# Patient Record
Sex: Male | Born: 1985 | Race: Black or African American | Hispanic: No | Marital: Single | Smoking: Current every day smoker
Health system: Southern US, Community
[De-identification: ages and names within clinical notes are randomized; demographics above are authoritative.]

## PROBLEM LIST (undated history)

## (undated) DIAGNOSIS — W3400XA Accidental discharge from unspecified firearms or gun, initial encounter: Secondary | ICD-10-CM

## (undated) DIAGNOSIS — I10 Essential (primary) hypertension: Secondary | ICD-10-CM

## (undated) HISTORY — PX: LEG SURGERY: SHX1003

## (undated) HISTORY — PX: ABDOMINAL SURGERY: SHX537

## (undated) HISTORY — PX: OTHER SURGICAL HISTORY: SHX169

---

## 2006-03-31 ENCOUNTER — Emergency Department (HOSPITAL_COMMUNITY): Admission: EM | Admit: 2006-03-31 | Discharge: 2006-03-31 | Payer: Self-pay | Admitting: Emergency Medicine

## 2006-04-29 ENCOUNTER — Emergency Department (HOSPITAL_COMMUNITY): Admission: EM | Admit: 2006-04-29 | Discharge: 2006-04-29 | Payer: Self-pay | Admitting: Emergency Medicine

## 2006-04-29 ENCOUNTER — Ambulatory Visit: Payer: Self-pay | Admitting: Internal Medicine

## 2006-09-15 ENCOUNTER — Emergency Department (HOSPITAL_COMMUNITY): Admission: EM | Admit: 2006-09-15 | Discharge: 2006-09-15 | Payer: Self-pay | Admitting: Emergency Medicine

## 2006-09-18 ENCOUNTER — Emergency Department (HOSPITAL_COMMUNITY): Admission: EM | Admit: 2006-09-18 | Discharge: 2006-09-18 | Payer: Self-pay | Admitting: Emergency Medicine

## 2008-06-09 ENCOUNTER — Emergency Department (HOSPITAL_COMMUNITY): Admission: EM | Admit: 2008-06-09 | Discharge: 2008-06-09 | Payer: Self-pay | Admitting: Emergency Medicine
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
## 2011-05-12 LAB — URINALYSIS, ROUTINE W REFLEX MICROSCOPIC
Glucose, UA: NEGATIVE
Protein, ur: NEGATIVE
Urobilinogen, UA: 0.2

## 2011-05-12 LAB — COMPREHENSIVE METABOLIC PANEL
Albumin: 4.4
Alkaline Phosphatase: 56
BUN: 8
CO2: 27
Creatinine, Ser: 0.81
GFR calc Af Amer: >60
GFR calc non Af Amer: >60
Glucose, Bld: 102 — ABNORMAL HIGH
Potassium: 3.6

## 2011-05-12 LAB — DIFFERENTIAL
Basophils Relative: 0
Lymphs Abs: 0.8
Monocytes Relative: 4
Neutro Abs: 10.6 — ABNORMAL HIGH

## 2011-05-12 LAB — CBC
HCT: 46.2
MCV: 101.5 — ABNORMAL HIGH
RBC: 4.55

## 2014-09-24 ENCOUNTER — Emergency Department (HOSPITAL_COMMUNITY): Payer: Self-pay

## 2014-09-24 ENCOUNTER — Emergency Department (HOSPITAL_COMMUNITY)
Admission: EM | Admit: 2014-09-24 | Discharge: 2014-09-24 | Disposition: A | Discharge status: Home / Self Care | Payer: Self-pay | Attending: Emergency Medicine | Admitting: Emergency Medicine

## 2014-09-24 ENCOUNTER — Encounter (HOSPITAL_COMMUNITY): Payer: Self-pay | Admitting: Emergency Medicine

## 2014-09-24 DIAGNOSIS — R112 Nausea with vomiting, unspecified: Secondary | ICD-10-CM | POA: Insufficient documentation

## 2014-09-24 DIAGNOSIS — Z9889 Other specified postprocedural states: Secondary | ICD-10-CM | POA: Insufficient documentation

## 2014-09-24 LAB — COMPREHENSIVE METABOLIC PANEL
ALBUMIN: 4.6 g/dL (ref 3.5–5.2)
ALK PHOS: 51 U/L (ref 39–117)
ALT: 34 U/L (ref 0–53)
ANION GAP: 6 (ref 5–15)
AST: 30 U/L (ref 0–37)
BILIRUBIN TOTAL: 1.2 mg/dL (ref 0.3–1.2)
BUN: 17 mg/dL (ref 6–23)
CHLORIDE: 107 mmol/L (ref 96–112)
CO2: 26 mmol/L (ref 19–32)
Calcium: 9.4 mg/dL (ref 8.4–10.5)
Creatinine, Ser: 0.97 mg/dL (ref 0.50–1.35)
GLUCOSE: 97 mg/dL (ref 70–99)
POTASSIUM: 4.2 mmol/L (ref 3.5–5.1)
Sodium: 139 mmol/L (ref 135–145)
TOTAL PROTEIN: 8.1 g/dL (ref 6.0–8.3)

## 2014-09-24 LAB — CBC WITH DIFFERENTIAL/PLATELET
Basophils Absolute: 0 10*3/uL (ref 0.0–0.1)
Basophils Relative: 0 % (ref 0–1)
EOS ABS: 0.1 10*3/uL (ref 0.0–0.7)
EOS PCT: 1 % (ref 0–5)
HCT: 47 % (ref 39.0–52.0)
Hemoglobin: 16.3 g/dL (ref 13.0–17.0)
LYMPHS ABS: 0.7 10*3/uL (ref 0.7–4.0)
LYMPHS PCT: 9 % — AB (ref 12–46)
MCH: 33.4 pg (ref 26.0–34.0)
MCHC: 34.7 g/dL (ref 30.0–36.0)
MCV: 96.3 fL (ref 78.0–100.0)
MONOS PCT: 6 % (ref 3–12)
Monocytes Absolute: 0.5 10*3/uL (ref 0.1–1.0)
NEUTROS ABS: 6.4 10*3/uL (ref 1.7–7.7)
NEUTROS PCT: 84 % — AB (ref 43–77)
Platelets: 153 10*3/uL (ref 150–400)
RBC: 4.88 MIL/uL (ref 4.22–5.81)
RDW: 12 % (ref 11.5–15.5)
WBC: 7.6 10*3/uL (ref 4.0–10.5)

## 2014-09-24 LAB — LIPASE, BLOOD: LIPASE: 24 U/L (ref 11–59)

## 2014-09-24 MED ORDER — IOHEXOL 300 MG/ML  SOLN
100.0000 mL | Freq: Once | INTRAMUSCULAR | Status: AC | PRN
Start: 2014-09-24 — End: 2014-09-24
  Administered 2014-09-24: 100 mL via INTRAVENOUS

## 2014-09-24 MED ORDER — ONDANSETRON 8 MG PO TBDP: 8.0000 mg | ORAL_TABLET | Freq: Three times a day (TID) | ORAL | Status: DC | PRN

## 2014-09-24 MED ORDER — SODIUM CHLORIDE 0.9 % IV SOLN
1000.0000 mL | INTRAVENOUS | Status: DC
  Administered 2014-09-24: 1000 mL via INTRAVENOUS

## 2014-09-24 MED ORDER — IOHEXOL 300 MG/ML  SOLN
50.0000 mL | Freq: Once | INTRAMUSCULAR | Status: AC | PRN
  Administered 2014-09-24: 50 mL via ORAL

## 2014-09-24 MED ORDER — ONDANSETRON HCL 4 MG/2ML IJ SOLN
4.0000 mg | Freq: Once | INTRAMUSCULAR | Status: AC
  Administered 2014-09-24: 4 mg via INTRAVENOUS
  Filled 2014-09-24: qty 2

## 2014-09-24 MED ORDER — SODIUM CHLORIDE 0.9 % IV SOLN
1000.0000 mL | Freq: Once | INTRAVENOUS | Status: AC
  Administered 2014-09-24: 1000 mL via INTRAVENOUS

## 2014-09-24 MED ORDER — PROMETHAZINE HCL 25 MG PO TABS: 25.0000 mg | ORAL_TABLET | Freq: Four times a day (QID) | ORAL | Status: DC | PRN

## 2014-09-24 MED ORDER — MORPHINE SULFATE 4 MG/ML IJ SOLN
4.0000 mg | Freq: Once | INTRAMUSCULAR | Status: AC
  Administered 2014-09-24: 4 mg via INTRAVENOUS
  Filled 2014-09-24: qty 1

## 2014-09-24 NOTE — ED Notes (Addendum)
Pt reports lower abdominal pain since this am. Pt reports n/v. Pt reports LBM yesterday.
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
## 2014-09-24 NOTE — Discharge Instructions (Signed)

## 2016-04-11 ENCOUNTER — Encounter (HOSPITAL_COMMUNITY): Payer: Self-pay | Admitting: Emergency Medicine

## 2016-04-11 ENCOUNTER — Emergency Department (HOSPITAL_COMMUNITY)
Admission: EM | Admit: 2016-04-11 | Discharge: 2016-04-11 | Disposition: A | Discharge status: Home / Self Care | Payer: Self-pay | Attending: Emergency Medicine | Admitting: Emergency Medicine

## 2016-04-11 DIAGNOSIS — Z79899 Other long term (current) drug therapy: Secondary | ICD-10-CM | POA: Insufficient documentation

## 2016-04-11 DIAGNOSIS — Y929 Unspecified place or not applicable: Secondary | ICD-10-CM | POA: Insufficient documentation

## 2016-04-11 DIAGNOSIS — F17210 Nicotine dependence, cigarettes, uncomplicated: Secondary | ICD-10-CM | POA: Insufficient documentation

## 2016-04-11 DIAGNOSIS — W57XXXA Bitten or stung by nonvenomous insect and other nonvenomous arthropods, initial encounter: Secondary | ICD-10-CM | POA: Insufficient documentation

## 2016-04-11 DIAGNOSIS — S40261A Insect bite (nonvenomous) of right shoulder, initial encounter: Secondary | ICD-10-CM | POA: Insufficient documentation

## 2016-04-11 DIAGNOSIS — S50862A Insect bite (nonvenomous) of left forearm, initial encounter: Secondary | ICD-10-CM | POA: Insufficient documentation

## 2016-04-11 DIAGNOSIS — Y999 Unspecified external cause status: Secondary | ICD-10-CM | POA: Insufficient documentation

## 2016-04-11 DIAGNOSIS — Y939 Activity, unspecified: Secondary | ICD-10-CM | POA: Insufficient documentation

## 2016-04-11 DIAGNOSIS — L819 Disorder of pigmentation, unspecified: Secondary | ICD-10-CM | POA: Insufficient documentation

## 2016-04-11 MED ORDER — FAMOTIDINE 20 MG PO TABS
20.0000 mg | ORAL_TABLET | Freq: Once | ORAL | Status: AC
  Administered 2016-04-11: 20 mg via ORAL
  Filled 2016-04-11: qty 1

## 2016-04-11 MED ORDER — DIPHENHYDRAMINE HCL 50 MG/ML IJ SOLN
50.0000 mg | Freq: Once | INTRAMUSCULAR | Status: AC
  Administered 2016-04-11: 50 mg via INTRAMUSCULAR
  Filled 2016-04-11: qty 1

## 2016-04-11 MED ORDER — PREDNISONE 20 MG PO TABS: ORAL_TABLET | ORAL | 0 refills | Status: DC

## 2016-04-11 MED ORDER — FAMOTIDINE 20 MG PO TABS: 20.0000 mg | ORAL_TABLET | Freq: Two times a day (BID) | ORAL | 0 refills | Status: DC

## 2016-04-11 MED ORDER — METHYLPREDNISOLONE SODIUM SUCC 125 MG IJ SOLR
125.0000 mg | Freq: Once | INTRAMUSCULAR | Status: AC
  Administered 2016-04-11: 125 mg via INTRAMUSCULAR
  Filled 2016-04-11: qty 2

## 2016-04-11 NOTE — Discharge Instructions (Signed)
Stay cool, heat will make the rash itch more. Take the medications as prescribed with zyrtec 10 mg once a day OR benadryl 50 mg every 6 hrs for itching or swelling. Recheck if you get difficulty swallowing or breathing.  Call Dr Okey RegalJohn Hall's office (dermatologist) to have that lesion evaluated on your left thigh.  His office is on [address], [place].  [phone] office phone number.
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
## 2016-04-11 NOTE — ED Triage Notes (Signed)
Patient notice insect bite to right forearm arm and right upper arm.

## 2016-04-11 NOTE — ED Triage Notes (Addendum)
Patient also has mole in right inner thigh he wants checked.

## 2016-09-03 ENCOUNTER — Encounter (HOSPITAL_COMMUNITY): Payer: Self-pay | Admitting: Emergency Medicine

## 2016-09-03 ENCOUNTER — Emergency Department (HOSPITAL_COMMUNITY)
Admission: EM | Admit: 2016-09-03 | Discharge: 2016-09-03 | Disposition: A | Discharge status: Home / Self Care | Payer: Self-pay | Attending: Emergency Medicine | Admitting: Emergency Medicine

## 2016-09-03 DIAGNOSIS — J029 Acute pharyngitis, unspecified: Secondary | ICD-10-CM | POA: Insufficient documentation

## 2016-09-03 DIAGNOSIS — F17210 Nicotine dependence, cigarettes, uncomplicated: Secondary | ICD-10-CM | POA: Insufficient documentation

## 2016-09-03 DIAGNOSIS — M791 Myalgia: Secondary | ICD-10-CM | POA: Insufficient documentation

## 2016-09-03 DIAGNOSIS — J111 Influenza due to unidentified influenza virus with other respiratory manifestations: Secondary | ICD-10-CM

## 2016-09-03 DIAGNOSIS — R69 Illness, unspecified: Secondary | ICD-10-CM

## 2016-09-03 DIAGNOSIS — R509 Fever, unspecified: Secondary | ICD-10-CM | POA: Insufficient documentation

## 2016-09-03 DIAGNOSIS — R0989 Other specified symptoms and signs involving the circulatory and respiratory systems: Secondary | ICD-10-CM | POA: Insufficient documentation

## 2016-09-03 DIAGNOSIS — J45909 Unspecified asthma, uncomplicated: Secondary | ICD-10-CM | POA: Insufficient documentation

## 2016-09-03 MED ORDER — IBUPROFEN 400 MG PO TABS
600.0000 mg | ORAL_TABLET | Freq: Once | ORAL | Status: AC
  Administered 2016-09-03: 600 mg via ORAL
  Filled 2016-09-03: qty 2

## 2016-09-03 MED ORDER — OSELTAMIVIR PHOSPHATE 75 MG PO CAPS: 75.0000 mg | ORAL_CAPSULE | Freq: Two times a day (BID) | ORAL | 0 refills | Status: DC

## 2016-09-03 MED ORDER — ACETAMINOPHEN 500 MG PO TABS
1000.0000 mg | ORAL_TABLET | Freq: Once | ORAL | Status: AC
  Administered 2016-09-03: 1000 mg via ORAL
  Filled 2016-09-03: qty 2

## 2016-09-03 MED ORDER — ONDANSETRON 8 MG PO TBDP: 8.0000 mg | ORAL_TABLET | Freq: Three times a day (TID) | ORAL | 0 refills | Status: DC | PRN

## 2016-09-03 NOTE — ED Triage Notes (Signed)
Patient starting having flu-like symptoms yesterday morning, sore throat, chills, running nose, has taken Nyquil at home.

## 2016-09-03 NOTE — ED Provider Notes (Signed)
[facility] Provider Note   CSN: [id number] Arrival date & time: 09/03/16  9562     History   Chief Complaint Chief Complaint  Patient presents with  . Fever    HPI Jeremiah Zuniga is a 31 y.o. male. Patient presents emergency department with sore throat and chills runny nose and myalgias.  He took NyQuil this morning without improvement in his symptoms.  He states he feels achy and feels weak.  Reports nausea without vomiting.  Denies diarrhea.  No sick contacts.  No significant past medical history for the patient.  History of asthma.  No shortness of breath.  Denies abdominal pain.   The history is provided by the patient.      History reviewed. No pertinent past medical history.  There are no active problems to display for this patient.   Past Surgical History:  Procedure Laterality Date  . ABDOMINAL SURGERY    . arm surgery    . LEG SURGERY         Home Medications    Prior to Admission medications   Medication Sig Start Date End Date Taking? Authorizing Provider  famotidine (PEPCID) 20 MG tablet Take 1 tablet (20 mg total) by mouth 2 (two) times daily. 04/11/16  Yes Devoria Albe, MD  ondansetron (ZOFRAN ODT) 8 MG disintegrating tablet Take 1 tablet (8 mg total) by mouth every 8 (eight) hours as needed for nausea or vomiting. 09/03/16   Azalia Bilis, MD  oseltamivir (TAMIFLU) 75 MG capsule Take 1 capsule (75 mg total) by mouth every 12 (twelve) hours. 09/03/16   Azalia Bilis, MD    Family History History reviewed. No pertinent family history.  Social History Social History  Substance Use Topics  . Smoking status: Current Every Day Smoker    Packs/day: 1.00    Years: 2.00    Types: Cigarettes  . Smokeless tobacco: Former Neurosurgeon  . Alcohol use No     Allergies   Patient has no known allergies.   Review of Systems Review of Systems  All other systems reviewed and are negative.    Physical Exam Updated Vital Signs BP 138/79   Pulse 99   Temp  99.3 F (37.4 C) (Oral)   Resp 20   Ht 6' (1.829 m)   Wt 215 lb (97.5 kg)   SpO2 95%   BMI 29.16 kg/m   Physical Exam  Constitutional: He is oriented to person, place, and time. He appears well-developed and well-nourished.  HENT:  Head: Normocephalic and atraumatic.  Posterior pharynx is normal  Eyes: EOM are normal.  Neck: Normal range of motion.  Cardiovascular: Normal rate, regular rhythm and normal heart sounds.   Pulmonary/Chest: Effort normal and breath sounds normal. No respiratory distress.  Abdominal: Soft. He exhibits no distension. There is no tenderness.  Musculoskeletal: Normal range of motion.  Neurological: He is alert and oriented to person, place, and time.  Skin: Skin is warm and dry.  Psychiatric: He has a normal mood and affect. Judgment normal.  Nursing note and vitals reviewed.    ED Treatments / Results  Labs (all labs ordered are listed, but only abnormal results are displayed) Labs Reviewed - No data to display  EKG  EKG Interpretation None       Radiology No results found.  Procedures Procedures (including critical care time)  Medications Ordered in ED Medications  ibuprofen (ADVIL,MOTRIN) tablet 600 mg (600 mg Oral Given 09/03/16 0732)  acetaminophen (TYLENOL) tablet 1,000 mg (1,000 mg  Oral Given 09/03/16 0732)     Initial Impression / Assessment and Plan / ED Course  I have reviewed the triage vital signs and the nursing notes.  Pertinent labs & imaging results that were available during my care of the patient were reviewed by me and considered in my medical decision making (see chart for details).     Likely influenza-like illness.  Home with instructions for oral hydration and fever control.  Patient understands return to the ER for new or worsening symptoms.  993 oral temperature here.  Heart rate 99.  Patient keeping fluids down.  Final Clinical Impressions(s) / ED Diagnoses   Final diagnoses:  Influenza-like illness     New Prescriptions New Prescriptions   ONDANSETRON (ZOFRAN ODT) 8 MG DISINTEGRATING TABLET    Take 1 tablet (8 mg total) by mouth every 8 (eight) hours as needed for nausea or vomiting.   OSELTAMIVIR (TAMIFLU) 75 MG CAPSULE    Take 1 capsule (75 mg total) by mouth every 12 (twelve) hours.     Azalia BilisKevin Phelan Schadt, MD 09/03/16 [phone]

## 2016-11-27 ENCOUNTER — Emergency Department (HOSPITAL_COMMUNITY)
Admission: EM | Admit: 2016-11-27 | Discharge: 2016-11-27 | Disposition: A | Discharge status: Home / Self Care | Payer: Self-pay | Attending: Dermatology | Admitting: Dermatology

## 2016-11-27 ENCOUNTER — Encounter (HOSPITAL_COMMUNITY): Payer: Self-pay | Admitting: *Deleted

## 2016-11-27 DIAGNOSIS — R42 Dizziness and giddiness: Secondary | ICD-10-CM | POA: Insufficient documentation

## 2016-11-27 DIAGNOSIS — F17210 Nicotine dependence, cigarettes, uncomplicated: Secondary | ICD-10-CM | POA: Insufficient documentation

## 2016-11-27 NOTE — ED Triage Notes (Addendum)
Pt c/o throat feeling "tight" and dizziness that happened this morning around 1230. Pt denies the throat tightness at this time but still reports the dizziness. Pt denies any unusual medications or food today. Pt states, "I feel like I'm going crazy".

## 2016-11-27 NOTE — ED Notes (Signed)
Called to go to room x 1 with no answer

## 2016-11-27 NOTE — ED Notes (Signed)
Called for pt with no response. 

## 2016-11-27 NOTE — ED Notes (Signed)
Called to triage again with no answer  

## 2016-11-28 ENCOUNTER — Emergency Department (HOSPITAL_COMMUNITY): Payer: Self-pay

## 2016-11-28 ENCOUNTER — Emergency Department (HOSPITAL_COMMUNITY)
Admission: EM | Admit: 2016-11-28 | Discharge: 2016-11-28 | Disposition: A | Discharge status: Home / Self Care | Payer: Self-pay | Attending: Emergency Medicine | Admitting: Emergency Medicine

## 2016-11-28 ENCOUNTER — Encounter (HOSPITAL_COMMUNITY): Payer: Self-pay | Admitting: *Deleted

## 2016-11-28 DIAGNOSIS — F17210 Nicotine dependence, cigarettes, uncomplicated: Secondary | ICD-10-CM | POA: Insufficient documentation

## 2016-11-28 DIAGNOSIS — R0789 Other chest pain: Secondary | ICD-10-CM | POA: Insufficient documentation

## 2016-11-28 LAB — CBC
HCT: 46.8 % (ref 39.0–52.0)
Hemoglobin: 16.2 g/dL (ref 13.0–17.0)
MCH: 33.2 pg (ref 26.0–34.0)
MCHC: 34.6 g/dL (ref 30.0–36.0)
MCV: 95.9 fL (ref 78.0–100.0)
Platelets: 162 10*3/uL (ref 150–400)
RBC: 4.88 MIL/uL (ref 4.22–5.81)
RDW: 12.3 % (ref 11.5–15.5)
WBC: 7.7 10*3/uL (ref 4.0–10.5)

## 2016-11-28 LAB — BASIC METABOLIC PANEL
Anion gap: 9 (ref 5–15)
BUN: 11 mg/dL (ref 6–20)
CO2: 26 mmol/L (ref 22–32)
CREATININE: 1.03 mg/dL (ref 0.61–1.24)
Calcium: 9.7 mg/dL (ref 8.9–10.3)
Chloride: 102 mmol/L (ref 101–111)
GFR calc non Af Amer: >60 mL/min (ref >60)
Glucose, Bld: 79 mg/dL (ref 65–99)
Potassium: 3.8 mmol/L (ref 3.5–5.1)
Sodium: 137 mmol/L (ref 135–145)

## 2016-11-28 LAB — I-STAT TROPONIN, ED
TROPONIN I, POC: 0 ng/mL (ref 0.00–0.08)
Troponin i, poc: 0 ng/mL (ref 0.00–0.08)

## 2016-11-28 LAB — D-DIMER, QUANTITATIVE (NOT AT ARMC): D DIMER QUANT: 0.46 ug{FEU}/mL (ref 0.00–0.50)

## 2016-11-28 MED ORDER — FAMOTIDINE 20 MG PO TABS: 20.0000 mg | ORAL_TABLET | Freq: Two times a day (BID) | ORAL | 0 refills | Status: DC

## 2016-11-28 MED ORDER — GI COCKTAIL ~~LOC~~
30.0000 mL | Freq: Once | ORAL | Status: AC
  Administered 2016-11-28: 30 mL via ORAL
  Filled 2016-11-28: qty 30

## 2016-11-28 MED ORDER — OMEPRAZOLE 20 MG PO CPDR: 20.0000 mg | DELAYED_RELEASE_CAPSULE | Freq: Every day | ORAL | 0 refills | Status: DC

## 2016-11-28 NOTE — ED Notes (Signed)
Pt st's he is irritated and ready to go home, st's he has been here all day and is hungry.

## 2016-11-28 NOTE — ED Notes (Signed)
Pt stable, ambulatory, states understanding of discharge instructions 

## 2016-11-28 NOTE — ED Notes (Signed)
Called Pt X3 to reassess vitals no answer

## 2016-11-28 NOTE — ED Triage Notes (Signed)
Pt states that he began having central chest pain 2 days ago. Pt states that pain is intermittent. Pt reports feeling lightheaded. Pt reports a sensation in his throat like he "had something there". Pt reports hx of acid reflux.
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
## 2016-11-28 NOTE — ED Notes (Signed)
Pt st's he feels much better after GI cocktail

## 2017-02-24 ENCOUNTER — Encounter (HOSPITAL_COMMUNITY): Payer: Self-pay | Admitting: Emergency Medicine

## 2017-02-24 ENCOUNTER — Emergency Department (HOSPITAL_COMMUNITY)
Admission: EM | Admit: 2017-02-24 | Discharge: 2017-02-24 | Disposition: A | Discharge status: Home / Self Care | Payer: Self-pay | Attending: Emergency Medicine | Admitting: Emergency Medicine

## 2017-02-24 DIAGNOSIS — F17210 Nicotine dependence, cigarettes, uncomplicated: Secondary | ICD-10-CM | POA: Insufficient documentation

## 2017-02-24 DIAGNOSIS — Z91040 Latex allergy status: Secondary | ICD-10-CM | POA: Insufficient documentation

## 2017-02-24 DIAGNOSIS — K029 Dental caries, unspecified: Secondary | ICD-10-CM | POA: Insufficient documentation

## 2017-02-24 DIAGNOSIS — Z79899 Other long term (current) drug therapy: Secondary | ICD-10-CM | POA: Insufficient documentation

## 2017-02-24 MED ORDER — IBUPROFEN 800 MG PO TABS
800.0000 mg | ORAL_TABLET | Freq: Once | ORAL | Status: AC
  Administered 2017-02-24: 800 mg via ORAL
  Filled 2017-02-24: qty 1

## 2017-02-24 MED ORDER — PENICILLIN V POTASSIUM 250 MG PO TABS
500.0000 mg | ORAL_TABLET | Freq: Once | ORAL | Status: AC
  Administered 2017-02-24: 500 mg via ORAL
  Filled 2017-02-24: qty 2

## 2017-02-24 MED ORDER — PENICILLIN V POTASSIUM 500 MG PO TABS: 500.0000 mg | ORAL_TABLET | Freq: Four times a day (QID) | ORAL | 0 refills | Status: AC

## 2017-02-24 MED ORDER — TRAMADOL HCL 50 MG PO TABS: 50.0000 mg | ORAL_TABLET | Freq: Four times a day (QID) | ORAL | 0 refills | Status: DC | PRN

## 2017-02-24 NOTE — ED Provider Notes (Signed)
TIME SEEN: 6:19 AM  CHIEF COMPLAINT: Left lower dental pain  HPI: Patient is a 31 year old male with no significant past history who presents to the emergency room several days of left lower dental pain. No facial swelling, fever. He does not have a dentist. No difficulty swallowing, speaking or breathing. Has taken Tylenol without relief.  ROS: See HPI Constitutional: no fever  Eyes: no drainage  ENT: no runny nose   Cardiovascular:  no chest pain  Resp: no SOB  GI: no vomiting GU: no dysuria Integumentary: no rash  Allergy: no hives  Musculoskeletal: no leg swelling  Neurological: no slurred speech ROS otherwise negative  PAST MEDICAL HISTORY/PAST SURGICAL HISTORY:  History reviewed. No pertinent past medical history.  MEDICATIONS:  Prior to Admission medications   Medication Sig Start Date End Date Taking? Authorizing Provider  famotidine (PEPCID) 20 MG tablet Take 1 tablet (20 mg total) by mouth 2 (two) times daily. 11/28/16   Fayrene Helperran, [place], PA-C  Multiple Vitamins-Minerals (ONE-A-DAY MENS HEALTH FORMULA) TABS Take 1 tablet by mouth daily.    [provider]  Omega-3 Fatty Acids (FISH OIL PO) Take 1 capsule by mouth 2 (two) times daily.    [provider]  omeprazole (PRILOSEC) 20 MG capsule Take 1 capsule (20 mg total) by mouth daily. 11/28/16   Fayrene Helperran, [place], PA-C  ondansetron (ZOFRAN ODT) 8 MG disintegrating tablet Take 1 tablet (8 mg total) by mouth every 8 (eight) hours as needed for nausea or vomiting. Patient not taking: Reported on 11/28/2016 09/03/16   Azalia Bilisampos, Kevin, MD  oseltamivir (TAMIFLU) 75 MG capsule Take 1 capsule (75 mg total) by mouth every 12 (twelve) hours. Patient not taking: Reported on 11/28/2016 09/03/16   Azalia Bilisampos, Kevin, MD    ALLERGIES:  Allergies  Allergen Reactions  . Banana Nausea And Vomiting  . Eggs Or Egg-Derived Products Nausea And Vomiting  . Fish Allergy Nausea And Vomiting  . Latex Rash    SOCIAL HISTORY:  Social History   Substance Use Topics  . Smoking status: Current Every Day Smoker    Packs/day: 1.00    Years: 2.00    Types: Cigarettes  . Smokeless tobacco: Former NeurosurgeonUser  . Alcohol use No    FAMILY HISTORY: No family history on file.  EXAM: BP (!) 142/106 (BP Location: Right Arm)   Pulse 79   Temp 98 F (36.7 C) (Oral)   Resp 16   Ht 6' (1.829 m)   Wt 99.8 kg (220 lb)   SpO2 100%   BMI 29.84 kg/m  CONSTITUTIONAL: Alert and oriented and responds appropriately to questions. Well-appearing; well-nourished HEAD: Normocephalic EYES: Conjunctivae clear, pupils appear equal, EOMI ENT: normal nose; moist mucous membranes; No pharyngeal erythema or petechiae, no tonsillar hypertrophy or exudate, no uvular deviation, no unilateral swelling, no trismus or drooling, no muffled voice, normal phonation, no stridor, patient's left lower third molar has decay noted and is tender to palpation, no drainable dental abscess noted, no swelling of the gums, no Ludwig's angina, tongue sits flat in the bottom of the mouth, no angioedema, no facial erythema or warmth, no facial swelling; no pain with movement of the neck. NECK: Supple, no meningismus, no nuchal rigidity, no LAD  CARD: RRR; S1 and S2 appreciated; no murmurs, no clicks, no rubs, no gallops RESP: Normal chest excursion without splinting or tachypnea; breath sounds clear and equal bilaterally; no wheezes, no rhonchi, no rales, no hypoxia or respiratory distress, speaking full sentences ABD/GI: Normal bowel sounds; non-distended;  soft, non-tender, no rebound, no guarding, no peritoneal signs, no hepatosplenomegaly BACK:  The back appears normal and is non-tender to palpation, there is no CVA tenderness EXT: Normal ROM in all joints; non-tender to palpation; no edema; normal capillary refill; no cyanosis, no calf tenderness or swelling    SKIN: Normal color for age and race; warm; no rash NEURO: Moves all extremities equally PSYCH: The patient's mood and  manner are appropriate. Grooming and personal hygiene are appropriate.  MEDICAL DECISION MAKING: Patient here with dental caries causing dental pain. We'll place on antibiotics. No Ludwig's angina. No sign of drainable abscess on exam. Hemodynamically stable. We'll discharge with short course of tramadol for pain control and recommended alternating Tylenol and Motrin. Have given him outpatient dental resource guide. Discussed return precautions.   At this time, I do not feel there is any life-threatening condition present. I have reviewed and discussed all results (EKG, imaging, lab, urine as appropriate) and exam findings with patient/family. I have reviewed nursing notes and appropriate previous records.  I feel the patient is safe to be discharged home without further emergent workup and can continue workup as an outpatient as needed. Discussed usual and customary return precautions. Patient/family verbalize understanding and are comfortable with this plan.  Outpatient follow-up has been provided if needed. All questions have been answered.     [place] Dodgen, Layla Maw, DO 02/24/17 [phone]

## 2017-02-24 NOTE — ED Triage Notes (Addendum)
Pt presents with sharp left lower jaw pain that started 2-3 days ago. Pt reports painful to chew. There is a hole on back lower left tooth. Denies difficulty swallowing. Denies oral trauma. No swelling noted.

## 2017-02-24 NOTE — Discharge Instructions (Signed)
You may alternate Tylenol 1000 mg every 6 hours as needed for pain and Ibuprofen 800 mg every 8 hours as needed for pain.  Please take Ibuprofen with food. ° °

## 2017-12-24 ENCOUNTER — Emergency Department (HOSPITAL_COMMUNITY): Payer: Self-pay | Admitting: Anesthesiology

## 2017-12-24 ENCOUNTER — Emergency Department (HOSPITAL_COMMUNITY): Payer: Self-pay

## 2017-12-24 ENCOUNTER — Encounter (HOSPITAL_COMMUNITY): Payer: Self-pay

## 2017-12-24 ENCOUNTER — Encounter (HOSPITAL_COMMUNITY)
Admission: EM | Disposition: A | Discharge status: Home / Self Care | Payer: Self-pay | Source: Home / Self Care | Attending: Emergency Medicine

## 2017-12-24 ENCOUNTER — Emergency Department (HOSPITAL_COMMUNITY)
Admission: EM | Admit: 2017-12-24 | Discharge: 2017-12-24 | Disposition: A | Discharge status: Home / Self Care | Payer: Self-pay | Attending: Emergency Medicine | Admitting: Emergency Medicine

## 2017-12-24 ENCOUNTER — Other Ambulatory Visit: Payer: Self-pay

## 2017-12-24 DIAGNOSIS — F17210 Nicotine dependence, cigarettes, uncomplicated: Secondary | ICD-10-CM | POA: Insufficient documentation

## 2017-12-24 DIAGNOSIS — K3580 Unspecified acute appendicitis: Secondary | ICD-10-CM | POA: Insufficient documentation

## 2017-12-24 DIAGNOSIS — Z9049 Acquired absence of other specified parts of digestive tract: Secondary | ICD-10-CM | POA: Insufficient documentation

## 2017-12-24 HISTORY — DX: Accidental discharge from unspecified firearms or gun, initial encounter: W34.00XA

## 2017-12-24 HISTORY — PX: LAPAROSCOPIC APPENDECTOMY: SHX408

## 2017-12-24 LAB — COMPREHENSIVE METABOLIC PANEL
ALT: 61 U/L (ref 17–63)
AST: 33 U/L (ref 15–41)
Albumin: 4.4 g/dL (ref 3.5–5.0)
Alkaline Phosphatase: 60 U/L (ref 38–126)
Anion gap: 7 (ref 5–15)
BUN: 12 mg/dL (ref 6–20)
CHLORIDE: 106 mmol/L (ref 101–111)
CO2: 24 mmol/L (ref 22–32)
CREATININE: 0.77 mg/dL (ref 0.61–1.24)
Calcium: 9.6 mg/dL (ref 8.9–10.3)
Glucose, Bld: 101 mg/dL — ABNORMAL HIGH (ref 65–99)
Potassium: 3.9 mmol/L (ref 3.5–5.1)
SODIUM: 137 mmol/L (ref 135–145)
Total Bilirubin: 1.2 mg/dL (ref 0.3–1.2)
Total Protein: 7.7 g/dL (ref 6.5–8.1)

## 2017-12-24 LAB — CBC WITH DIFFERENTIAL/PLATELET
BASOS PCT: 0 %
Basophils Absolute: 0 10*3/uL (ref 0.0–0.1)
EOS ABS: 0.3 10*3/uL (ref 0.0–0.7)
Eosinophils Relative: 2 %
HCT: 45 % (ref 39.0–52.0)
Hemoglobin: 15.7 g/dL (ref 13.0–17.0)
LYMPHS ABS: 1.8 10*3/uL (ref 0.7–4.0)
Lymphocytes Relative: 13 %
MCH: 34.3 pg — AB (ref 26.0–34.0)
MCHC: 34.9 g/dL (ref 30.0–36.0)
MCV: 98.3 fL (ref 78.0–100.0)
Monocytes Absolute: 1.1 10*3/uL — ABNORMAL HIGH (ref 0.1–1.0)
Monocytes Relative: 8 %
Neutro Abs: 11 10*3/uL — ABNORMAL HIGH (ref 1.7–7.7)
Neutrophils Relative %: 77 %
PLATELETS: 157 10*3/uL (ref 150–400)
RBC: 4.58 MIL/uL (ref 4.22–5.81)
RDW: 12.1 % (ref 11.5–15.5)
WBC: 14.2 10*3/uL — AB (ref 4.0–10.5)

## 2017-12-24 SURGERY — APPENDECTOMY, LAPAROSCOPIC
Anesthesia: General | Site: Abdomen

## 2017-12-24 MED ORDER — ONDANSETRON HCL 4 MG/2ML IJ SOLN
4.0000 mg | Freq: Once | INTRAMUSCULAR | Status: AC
  Administered 2017-12-24: 4 mg via INTRAVENOUS
  Filled 2017-12-24: qty 2

## 2017-12-24 MED ORDER — BUPIVACAINE HCL (PF) 0.5 % IJ SOLN
INTRAMUSCULAR | Status: AC
  Filled 2017-12-24: qty 30

## 2017-12-24 MED ORDER — SODIUM CHLORIDE 0.9 % IR SOLN
Status: DC | PRN
  Administered 2017-12-24: 1000 mL

## 2017-12-24 MED ORDER — HYDROMORPHONE HCL 1 MG/ML IJ SOLN: 0.2500 mg | INTRAMUSCULAR | Status: DC | PRN

## 2017-12-24 MED ORDER — CEFOTETAN DISODIUM-DEXTROSE 2-2.08 GM-%(50ML) IV SOLR
INTRAVENOUS | Status: AC
  Filled 2017-12-24: qty 50

## 2017-12-24 MED ORDER — SODIUM CHLORIDE 0.9 % IV SOLN
2.0000 g | Freq: Once | INTRAVENOUS | Status: AC
  Administered 2017-12-24: 2 g via INTRAVENOUS
  Filled 2017-12-24: qty 2

## 2017-12-24 MED ORDER — FENTANYL CITRATE (PF) 100 MCG/2ML IJ SOLN
INTRAMUSCULAR | Status: AC
  Filled 2017-12-24: qty 2

## 2017-12-24 MED ORDER — ROCURONIUM BROMIDE 100 MG/10ML IV SOLN
INTRAVENOUS | Status: DC | PRN
  Administered 2017-12-24: 30 mg via INTRAVENOUS
  Administered 2017-12-24: 10 mg via INTRAVENOUS

## 2017-12-24 MED ORDER — FENTANYL CITRATE (PF) 100 MCG/2ML IJ SOLN
INTRAMUSCULAR | Status: DC | PRN
  Administered 2017-12-24 (×5): 50 ug via INTRAVENOUS

## 2017-12-24 MED ORDER — HYDROCODONE-ACETAMINOPHEN 7.5-325 MG PO TABS: 1.0000 | ORAL_TABLET | Freq: Once | ORAL | Status: DC | PRN

## 2017-12-24 MED ORDER — ONDANSETRON HCL 4 MG/2ML IJ SOLN: 4.0000 mg | Freq: Once | INTRAMUSCULAR | Status: DC | PRN

## 2017-12-24 MED ORDER — LACTATED RINGERS IV BOLUS
1000.0000 mL | Freq: Once | INTRAVENOUS | Status: AC
  Administered 2017-12-24: 1000 mL via INTRAVENOUS

## 2017-12-24 MED ORDER — BUPIVACAINE HCL (PF) 0.5 % IJ SOLN
INTRAMUSCULAR | Status: DC | PRN
  Administered 2017-12-24: 10 mL

## 2017-12-24 MED ORDER — DEXMEDETOMIDINE HCL 200 MCG/2ML IV SOLN
INTRAVENOUS | Status: AC
  Filled 2017-12-24: qty 2

## 2017-12-24 MED ORDER — SUGAMMADEX SODIUM 200 MG/2ML IV SOLN
INTRAVENOUS | Status: AC
  Filled 2017-12-24: qty 2

## 2017-12-24 MED ORDER — DEXMEDETOMIDINE HCL IN NACL 200 MCG/50ML IV SOLN
INTRAVENOUS | Status: DC | PRN
  Administered 2017-12-24 (×2): 40 ug via INTRAVENOUS

## 2017-12-24 MED ORDER — FENTANYL CITRATE (PF) 250 MCG/5ML IJ SOLN
INTRAMUSCULAR | Status: AC
  Filled 2017-12-24: qty 5

## 2017-12-24 MED ORDER — MIDAZOLAM HCL 2 MG/2ML IJ SOLN
INTRAMUSCULAR | Status: AC
  Filled 2017-12-24: qty 2

## 2017-12-24 MED ORDER — DEXAMETHASONE SODIUM PHOSPHATE 4 MG/ML IJ SOLN
INTRAMUSCULAR | Status: DC | PRN
  Administered 2017-12-24: 8 mg via INTRAVENOUS

## 2017-12-24 MED ORDER — KETOROLAC TROMETHAMINE 30 MG/ML IJ SOLN
30.0000 mg | Freq: Once | INTRAMUSCULAR | Status: AC | PRN
  Administered 2017-12-24: 30 mg via INTRAVENOUS
  Filled 2017-12-24: qty 1

## 2017-12-24 MED ORDER — HYDROMORPHONE HCL 1 MG/ML IJ SOLN
1.0000 mg | Freq: Once | INTRAMUSCULAR | Status: AC
  Administered 2017-12-24: 1 mg via INTRAVENOUS
  Filled 2017-12-24: qty 1

## 2017-12-24 MED ORDER — METRONIDAZOLE IN NACL 5-0.79 MG/ML-% IV SOLN
500.0000 mg | Freq: Once | INTRAVENOUS | Status: AC
  Administered 2017-12-24: 500 mg via INTRAVENOUS
  Filled 2017-12-24: qty 100

## 2017-12-24 MED ORDER — PROPOFOL 10 MG/ML IV BOLUS
INTRAVENOUS | Status: DC | PRN
  Administered 2017-12-24: 200 mg via INTRAVENOUS

## 2017-12-24 MED ORDER — PROPOFOL 10 MG/ML IV BOLUS
INTRAVENOUS | Status: AC
  Filled 2017-12-24: qty 20

## 2017-12-24 MED ORDER — ONDANSETRON HCL 4 MG/2ML IJ SOLN
INTRAMUSCULAR | Status: DC | PRN
  Administered 2017-12-24: 4 mg via INTRAVENOUS

## 2017-12-24 MED ORDER — ROCURONIUM BROMIDE 50 MG/5ML IV SOLN
INTRAVENOUS | Status: AC
  Filled 2017-12-24: qty 1

## 2017-12-24 MED ORDER — SUGAMMADEX SODIUM 200 MG/2ML IV SOLN
INTRAVENOUS | Status: DC | PRN
  Administered 2017-12-24: 300 mg via INTRAVENOUS

## 2017-12-24 MED ORDER — CHLORHEXIDINE GLUCONATE CLOTH 2 % EX PADS: 6.0000 | MEDICATED_PAD | Freq: Once | CUTANEOUS | Status: DC

## 2017-12-24 MED ORDER — GLYCOPYRROLATE 0.2 MG/ML IJ SOLN
INTRAMUSCULAR | Status: AC
  Filled 2017-12-24: qty 1

## 2017-12-24 MED ORDER — LIDOCAINE HCL (CARDIAC) PF 100 MG/5ML IV SOSY
PREFILLED_SYRINGE | INTRAVENOUS | Status: DC | PRN
  Administered 2017-12-24: 100 mg via INTRAVENOUS

## 2017-12-24 MED ORDER — ONDANSETRON HCL 4 MG/2ML IJ SOLN
INTRAMUSCULAR | Status: AC
Start: 2017-12-24 — End: ?
  Filled 2017-12-24: qty 2

## 2017-12-24 MED ORDER — PROPOFOL 10 MG/ML IV BOLUS
INTRAVENOUS | Status: AC
  Filled 2017-12-24: qty 40

## 2017-12-24 MED ORDER — LACTATED RINGERS IV SOLN
INTRAVENOUS | Status: DC
  Administered 2017-12-24 (×2): via INTRAVENOUS

## 2017-12-24 MED ORDER — MIDAZOLAM HCL 2 MG/2ML IJ SOLN
INTRAMUSCULAR | Status: DC | PRN
  Administered 2017-12-24: 2 mg via INTRAVENOUS

## 2017-12-24 MED ORDER — MEPERIDINE HCL 50 MG/ML IJ SOLN: 6.2500 mg | INTRAMUSCULAR | Status: DC | PRN

## 2017-12-24 MED ORDER — LIDOCAINE HCL (PF) 1 % IJ SOLN
INTRAMUSCULAR | Status: AC
  Filled 2017-12-24: qty 5

## 2017-12-24 MED ORDER — HYDROMORPHONE HCL 1 MG/ML IJ SOLN
1.0000 mg | Freq: Once | INTRAMUSCULAR | Status: AC
Start: 2017-12-24 — End: 2017-12-24
  Administered 2017-12-24: 1 mg via INTRAVENOUS
  Filled 2017-12-24: qty 1

## 2017-12-24 MED ORDER — OXYCODONE HCL 5 MG PO TABS: 5.0000 mg | ORAL_TABLET | ORAL | 0 refills | Status: DC | PRN

## 2017-12-24 MED ORDER — DEXAMETHASONE SODIUM PHOSPHATE 4 MG/ML IJ SOLN
INTRAMUSCULAR | Status: AC
  Filled 2017-12-24: qty 2

## 2017-12-24 MED ORDER — GLYCOPYRROLATE 0.2 MG/ML IJ SOLN
INTRAMUSCULAR | Status: DC | PRN
  Administered 2017-12-24: 0.1 mg via INTRAVENOUS

## 2017-12-24 MED ORDER — KETAMINE HCL 10 MG/ML IJ SOLN
0.3000 mg/kg | Freq: Once | INTRAMUSCULAR | Status: AC | PRN
  Administered 2017-12-24: 29 mg via INTRAVENOUS
  Filled 2017-12-24: qty 1

## 2017-12-24 MED ORDER — DOCUSATE SODIUM 100 MG PO CAPS: 100.0000 mg | ORAL_CAPSULE | Freq: Two times a day (BID) | ORAL | 2 refills | Status: DC

## 2017-12-24 MED ORDER — SUCCINYLCHOLINE CHLORIDE 200 MG/10ML IV SOSY
PREFILLED_SYRINGE | INTRAVENOUS | Status: DC | PRN
Start: 2017-12-24 — End: 2017-12-24
  Administered 2017-12-24: 180 mg via INTRAVENOUS

## 2017-12-24 MED ORDER — CEFTRIAXONE SODIUM 2 G IJ SOLR
2.0000 g | Freq: Once | INTRAMUSCULAR | Status: AC
  Administered 2017-12-24: 2 g via INTRAVENOUS
  Filled 2017-12-24: qty 20

## 2017-12-24 MED ORDER — IOPAMIDOL (ISOVUE-300) INJECTION 61%
100.0000 mL | Freq: Once | INTRAVENOUS | Status: AC | PRN
  Administered 2017-12-24: 100 mL via INTRAVENOUS

## 2017-12-24 MED ORDER — PROPOFOL 10 MG/ML IV BOLUS
INTRAVENOUS | Status: AC
  Filled 2017-12-24: qty 80

## 2017-12-24 SURGICAL SUPPLY — 44 items
ADH SKN CLS APL DERMABOND .7 (GAUZE/BANDAGES/DRESSINGS) ×1
BAG RETRIEVAL 10 (BASKET) ×1
BAG RETRIEVAL 10MM (BASKET) ×1
BLADE SURG 15 STRL LF DISP TIS (BLADE) ×1 IMPLANT
BLADE SURG 15 STRL SS (BLADE) ×3
CHLORAPREP W/TINT 26ML (MISCELLANEOUS) ×3 IMPLANT
CLOTH BEACON ORANGE TIMEOUT ST (SAFETY) ×3 IMPLANT
COVER LIGHT HANDLE STERIS (MISCELLANEOUS) ×6 IMPLANT
CUTTER FLEX LINEAR 45M (STAPLE) ×3 IMPLANT
DECANTER SPIKE VIAL GLASS SM (MISCELLANEOUS) ×3 IMPLANT
DERMABOND ADVANCED (GAUZE/BANDAGES/DRESSINGS) ×2
DERMABOND ADVANCED .7 DNX12 (GAUZE/BANDAGES/DRESSINGS) ×1 IMPLANT
ELECT REM PT RETURN 9FT ADLT (ELECTROSURGICAL) ×3
ELECTRODE REM PT RTRN 9FT ADLT (ELECTROSURGICAL) ×1 IMPLANT
GLOVE BIOGEL PI IND STRL 6.5 (GLOVE) ×1 IMPLANT
GLOVE BIOGEL PI INDICATOR 6.5 (GLOVE) ×2
GLOVE SURG SS PI 6.5 STRL IVOR (GLOVE) ×4 IMPLANT
GOWN STRL REUS W/TWL LRG LVL3 (GOWN DISPOSABLE) ×6 IMPLANT
INST SET LAPROSCOPIC AP (KITS) ×3 IMPLANT
KIT TURNOVER KIT A (KITS) ×3 IMPLANT
MANIFOLD NEPTUNE II (INSTRUMENTS) ×3 IMPLANT
NDL INSUFFLATION 14GA 120MM (NEEDLE) ×1 IMPLANT
NEEDLE INSUFFLATION 14GA 120MM (NEEDLE) ×3 IMPLANT
NS IRRIG 1000ML POUR BTL (IV SOLUTION) ×3 IMPLANT
PACK LAP CHOLE LZT030E (CUSTOM PROCEDURE TRAY) ×3 IMPLANT
PAD ARMBOARD 7.5X6 YLW CONV (MISCELLANEOUS) ×3 IMPLANT
RELOAD STAPLE 45 3.5 BLU ETS (ENDOMECHANICALS) IMPLANT
RELOAD STAPLE TA45 3.5 REG BLU (ENDOMECHANICALS) ×6 IMPLANT
SET BASIN LINEN APH (SET/KITS/TRAYS/PACK) ×3 IMPLANT
SET TUBE IRRIG SUCTION NO TIP (IRRIGATION / IRRIGATOR) IMPLANT
SHEARS HARMONIC ACE PLUS 36CM (ENDOMECHANICALS) ×3 IMPLANT
SLEEVE ENDOPATH XCEL 5M (ENDOMECHANICALS) IMPLANT
SUT MNCRL AB 4-0 PS2 18 (SUTURE) ×3 IMPLANT
SUT VICRYL 0 UR6 27IN ABS (SUTURE) ×3 IMPLANT
SYS BAG RETRIEVAL 10MM (BASKET) ×1
SYSTEM BAG RETRIEVAL 10MM (BASKET) ×1 IMPLANT
TRAY FOL W/BAG SLVR 16FR STRL (SET/KITS/TRAYS/PACK) IMPLANT
TRAY FOLEY W/BAG SLVR 16FR LF (SET/KITS/TRAYS/PACK) ×3
TROCAR ENDO BLADELESS 11MM (ENDOMECHANICALS) ×3 IMPLANT
TROCAR ENDO BLADELESS 12MM (ENDOMECHANICALS) ×3 IMPLANT
TROCAR XCEL NON-BLD 5MMX100MML (ENDOMECHANICALS) ×3 IMPLANT
TUBING INSUFFLATION (TUBING) ×3 IMPLANT
WARMER LAPAROSCOPE (MISCELLANEOUS) ×3 IMPLANT
YANKAUER SUCT 12FT TUBE ARGYLE (SUCTIONS) ×3 IMPLANT
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
## 2017-12-24 NOTE — ED Notes (Signed)
Dr. Henreitta Leber at bedside speaking with patient and family.

## 2017-12-24 NOTE — ED Notes (Signed)
Consent form signed by patient, Dr. Henreitta Leber, and witnessed by this RN.

## 2017-12-24 NOTE — ED Notes (Signed)
MD at bedside to answer pt's questions about procedure.

## 2017-12-24 NOTE — Anesthesia Postprocedure Evaluation (Signed)
Anesthesia Post Note  Patient: Jeremiah Zuniga  Procedure(s) Performed: APPENDECTOMY LAPAROSCOPIC (N/A Abdomen)  Patient location during evaluation: PACU Anesthesia Type: General Level of consciousness: awake and alert Pain management: pain level controlled Vital Signs Assessment: post-procedure vital signs reviewed and stable Respiratory status: spontaneous breathing Cardiovascular status: stable and blood pressure returned to baseline Postop Assessment: no apparent nausea or vomiting Anesthetic complications: no     Last Vitals:  Vitals:   12/24/17 1330 12/24/17 1345  BP: 126/85 127/89  Pulse: 72 71  Resp: 17 20  Temp:    SpO2: 96% 96%    Last Pain:  Vitals:   12/24/17 1345  TempSrc:   PainSc: 0-No pain                 Raylynne Cubbage

## 2017-12-24 NOTE — ED Notes (Signed)
Dr. Henreitta Leber at bedside, informed consent form in room.

## 2017-12-24 NOTE — Progress Notes (Signed)
Patient requested to get dressed and go home.

## 2017-12-24 NOTE — Op Note (Signed)
[facility]  Date of Surgery: 12/24/2017  Admit Date: 12/24/2017   Performing Service: General  Surgeon(s) and Role:    * Lucretia Roers, MD - Primary   Pre-operative Diagnosis: Acute Appendicitis  Post-operative Diagnosis: Acute Appendicitis  Procedure Performed: Laparoscopic Appendectomy   Surgeon: Leatrice Jewels. Henreitta Leber, MD   Assistant: No qualified resident was available.   Anesthesia: General   Findings:  The appendix was found to be inflamed. There were not signs of necrosis. There was not perforation. There was not abscess formation.   Estimated Blood Loss: Minimal   Specimens:  ID Type Source Tests Collected by Time Destination  1 : appendix GI Appendix SURGICAL PATHOLOGY Lucretia Roers, MD 12/24/2017 1213      Complications: None; patient tolerated the procedure well.   Disposition: PACU - hemodynamically stable.   Condition: stable   Indications: The patient presented with a 1 day history of right-sided abdominal pain. A CT revealed findings consistent with acute appendicitis.   Procedure Details  Prior to the procedure, the risks, benefits, complications, treatment options, and expected outcomes were discussed with the patient and/or family, including but not limited to the risk of bleeding, infection, finding of a normal appendix, and the need for conversion to an open procedure. There was concurrence with the proposed plan and informed consent was obtained. The patient was taken to the operating room, identified as Jeremiah Zuniga and the procedure verified as Laproscopic Appendectomy.    The patient was placed in the supine position and general anesthesia was induced, along with placement of orogastric tube, SCD's, and a Foley catheter. The abdomen was prepped and draped in a sterile fashion. The abdomen was entered with Veress technique in the left upper quadrant. Intraperitoneal placement was confirmed with saline drop, low entry pressures,  and easy insufflation. An incision was made supraumbilical in the vertical direction of his midline incision, and a 10 mm optiview trocar was placed under direct visualization with a 0 degree scope. The 10 mm 0 degree scope was placed in the abdomen and no evidence of injury was identified.  A careful evaluation of the entire abdomen was carried out. There were omental adhesions that were attached only to the midline. I could peak around and see the left lower quadrant.  A 12 mm port was placed in the left lower quadrant of the abdomen after skin incision with trocar placement under direct vision.   An additional 5 mm port was placed in the suprapubic area under direct vision.  The harmonic was used to take down the omentum at the midline incision that basically draped from the umbilicus to the suprapubic area.   The patient was placed in Trendelenburg and left lateral decubitus position. The small intestines were retracted in the cephalad and left lateral direction away from the pelvis and right lower quadrant. The patient was found to have an inflamed and thickened appendix. The appendix was long and the base was healthy with the tip having the inflammation and thickening. There was not evidence of perforation.   The appendix was carefully dissected. A window was made in the mesoappendix at the base of the appendix. The appendix was divided at its base using another tan endo-GIA stapler. Minimal appendiceal stump was left in place. The mesoappendix was taken with the harmonic energy device. The appendix was placed within an Endocatch specimen bag. There was no evidence of bleeding, leakage, or complication after division of the appendix.  The endocatch bag was removed  via the 12 mm port, then the abdomen desufflated. The appendix was passed off the field as a specimen.   The the 12 mm and 10 mm port sites were closed with a 0 Vicryl suture. The trocar site skin wounds were closed using subcuticular 4-0  Monocryl suture and dermabond. The patient was then awakened from general anesthesia, extubated, and taken to PACU for recovery.   Instrument, sponge, and needle counts were correct at the conclusion of the case.   Algis Greenhouse, MD [facility] [address] [place], [place] [address] [phone] (office)

## 2017-12-24 NOTE — ED Notes (Signed)
EDP at bedside at bedside updating patient and family.

## 2017-12-24 NOTE — Anesthesia Procedure Notes (Signed)
Procedure Name: Intubation Date/Time: 12/24/2017 11:26 AM Performed by: Georgeanne Nim, CRNA Pre-anesthesia Checklist: Patient identified, Emergency Drugs available, Suction available, Patient being monitored and Timeout performed Patient Re-evaluated:Patient Re-evaluated prior to induction Oxygen Delivery Method: Circle system utilized Preoxygenation: Pre-oxygenation with 100% oxygen Induction Type: IV induction, Rapid sequence and Cricoid Pressure applied Laryngoscope Size: Mac and 4 Grade View: Grade I Tube type: Oral Tube size: 8.0 mm Number of attempts: 1 Airway Equipment and Method: Stylet Placement Confirmation: ETT inserted through vocal cords under direct vision,  positive ETCO2,  CO2 detector and breath sounds checked- equal and bilateral Secured at: 23 cm Dental Injury: Teeth and Oropharynx as per pre-operative assessment

## 2017-12-24 NOTE — ED Provider Notes (Signed)
Acute appendicitis on CT with significant fat stranding and small amount of peri-appendiceal fluid.  Discussed with Dr. Henreitta Leber who will see patient in the emergency department.  Initiated protocol antibiotics.   Loren Racer, MD 12/24/17 [phone]
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
## 2017-12-24 NOTE — Discharge Instructions (Signed)
Discharge Instructions: Shower per your regular routine. Take tylenol and ibuprofen as needed for pain control, alternating every 4-6 hours.  Take Roxicodone for breakthrough pain. Take colace for constipation related to narcotic pain medication. Do not pick at the dermabond glue on your incision sites.   Laparoscopic Appendectomy, Adult, Care After These instructions give you information about caring for yourself after your procedure. Your doctor may also give you more specific instructions. Call your doctor if you have any problems or questions after your procedure. Follow these instructions at home: Medicines  Take over-the-counter and prescription medicines only as told by your doctor.  Do not drive for 24 hours if you received a sedative.  Do not drive or use heavy machinery while taking prescription pain medicine.  If you were prescribed an antibiotic medicine, take it as told by your doctor. Do not stop taking it even if you start to feel better. Activity  Do not lift anything that is heavier than 10 pounds (4.5 kg) for 1 week.  Do not play contact sports for 3 weeks or as told by your doctor.  Slowly return to your normal activities. Bathing  Keep your cuts from surgery (incisions) clean and dry. ? Gently wash the cuts with soap and water. ? Rinse the cuts with water until the soap is gone. ? Pat the cuts dry with a clean towel. Do not rub the cuts.  You may take showers starting tomorrow.  Do not take baths, swim, or use a hot tub for 2 weeks or as told by your doctor. Cut Care  Follow instructions from your doctor about how to take care of your cuts. Make sure you: ? Wash your hands with soap and water before you change your bandage (dressing). If you do not have soap and water, use hand sanitizer. ? Change your bandage as told by your doctor. ? Leave stitches (sutures), skin glue, or skin tape (adhesive) strips in place. They may need to stay in place for 2 weeks  or longer. If tape strips get loose and curl up, you may trim the loose edges. Do not remove tape strips completely unless your doctor says it is okay.  Check your cuts every day for signs of infection. Check for: ? More redness, swelling, or pain. ? More fluid or blood. ? Warmth. ? Pus or a bad smell. Other Instructions  If you were sent home with a drain, follow instructions from your doctor about how to use it and care for it.  Take deep breaths. This helps to keep your lungs from getting swollen (inflamed).  To help with constipation: ? Drink plenty of fluids. ? Eat plenty of fruits and vegetables.  Keep all follow-up visits as told by your doctor. This is important. Contact a doctor if:  You have more redness, swelling, or pain around a cut from surgery.  You have more fluid or blood coming from a cut.  Your cut feels warm to the touch.  You have pus or a bad smell coming from a cut or a bandage.  The edges of a cut break open after the stitches have been taken out.  You have pain in your shoulders that gets worse.  You feel dizzy or you pass out (faint).  You have shortness of breath.  You keep feeling sick to your stomach (nauseous).  You keep throwing up (vomiting).  You get diarrhea or you cannot control your poop.  You lose your appetite.  You have swelling or  pain in your legs. Get help right away if:  You have a fever.  You get a rash.  You have trouble breathing.  You have sharp pains in your chest. This information is not intended to replace advice given to you by your health care provider. Make sure you discuss any questions you have with your health care provider. Document Released: 05/24/2009 Document Revised: 01/03/2016 Document Reviewed: 01/15/2015 Elsevier Interactive Patient Education  2018 [employer].    General Anesthesia, Adult, Care After These instructions provide you with information about caring for yourself after your  procedure. Your health care provider may also give you more specific instructions. Your treatment has been planned according to current medical practices, but problems sometimes occur. Call your health care provider if you have any problems or questions after your procedure. What can I expect after the procedure? After the procedure, it is common to have:  Vomiting.  A sore throat.  Mental slowness.  It is common to feel:  Nauseous.  Cold or shivery.  Sleepy.  Tired.  Sore or achy, even in parts of your body where you did not have surgery.  Follow these instructions at home: For at least 24 hours after the procedure:  Do not: ? Participate in activities where you could fall or become injured. ? Drive. ? Use heavy machinery. ? Drink alcohol. ? Take sleeping pills or medicines that cause drowsiness. ? Make important decisions or sign legal documents. ? Take care of children on your own.  Rest. Eating and drinking  If you vomit, drink water, juice, or soup when you can drink without vomiting.  Drink enough fluid to keep your urine clear or pale yellow.  Make sure you have little or no nausea before eating solid foods.  Follow the diet recommended by your health care provider. General instructions  Have a responsible adult stay with you until you are awake and alert.  Return to your normal activities as told by your health care provider. Ask your health care provider what activities are safe for you.  Take over-the-counter and prescription medicines only as told by your health care provider.  If you smoke, do not smoke without supervision.  Keep all follow-up visits as told by your health care provider. This is important. Contact a health care provider if:  You continue to have nausea or vomiting at home, and medicines are not helpful.  You cannot drink fluids or start eating again.  You cannot urinate after 8-12 hours.  You develop a skin rash.  You have  fever.  You have increasing redness at the site of your procedure. Get help right away if:  You have difficulty breathing.  You have chest pain.  You have unexpected bleeding.  You feel that you are having a life-threatening or urgent problem. This information is not intended to replace advice given to you by your health care provider. Make sure you discuss any questions you have with your health care provider. Document Released: 11/03/2000 Document Revised: 12/31/2015 Document Reviewed: 07/12/2015 Elsevier Interactive Patient Education  [employer].

## 2017-12-24 NOTE — ED Notes (Signed)
Updated OR/Short stay staff that patient is refusing to leave ED until mother arrives. ETA 30 min.

## 2017-12-24 NOTE — ED Notes (Signed)
ED Provider at bedside. 

## 2017-12-24 NOTE — Progress Notes (Signed)
[facility]  Discussed risk and benefits of laparoscopic appendectomy possible open with the patient and his mother/ father. All parties are engaged and understanding and wanting to proceed with surgery.   Discussed risk of bleeding, infection, risk of abscess following surgery and need IR drain, risk of needing open surgery due to scar tissue, risk of injury to other organs.  Discussed the benefits of shorter hospital stay and keeping appendicitis from recurring.  Patient has signed the consent and is ready to proceed with surgery.  Of note he did receive some ketamine prior to the initial attempt at discussing surgery. So some of this might explain his dismissive attitude and inability to process what was going on.  Jeremiah Greenhouse, MD [facility] [address] [place], [place] [address] [phone] (office)

## 2017-12-24 NOTE — ED Triage Notes (Signed)
Pt reports lower abd pain and distension onset yesterday (Wed) morning, states he is nauseated,but has not vomited. Pt reports last bm was Sunday and normally has a bm every day

## 2017-12-24 NOTE — Progress Notes (Signed)
Woke up violently but otherwise ok. Still planning for discharge as long as no issues in pacu.   Algis Greenhouse, MD

## 2017-12-24 NOTE — Transfer of Care (Signed)
Immediate Anesthesia Transfer of Care Note  Patient: Jeremiah Zuniga  Procedure(s) Performed: APPENDECTOMY LAPAROSCOPIC (N/A Abdomen)  Patient Location: PACU  Anesthesia Type:General  Level of Consciousness: awake and patient cooperative  Airway & Oxygen Therapy: Patient Spontanous Breathing and Patient connected to face mask oxygen  Post-op Assessment: Report given to RN and Post -op Vital signs reviewed and stable  Post vital signs: Reviewed and stable  Last Vitals:  Vitals Value Taken Time  BP 126/86 12/24/2017  1:00 PM  Temp 36.7 C 12/24/2017 12:48 PM  Pulse 82 12/24/2017  1:00 PM  Resp 25 12/24/2017  1:00 PM  SpO2 100 % 12/24/2017  1:00 PM  Vitals shown include unvalidated device data.  Last Pain:  Vitals:   12/24/17 1248  TempSrc:   PainSc: Asleep         Complications: No apparent anesthesia complications

## 2017-12-24 NOTE — Anesthesia Preprocedure Evaluation (Signed)
Anesthesia Evaluation  Patient identified by MRN, date of birth, ID band Patient awake    Reviewed: Allergy & Precautions, H&P , NPO status , Patient's Chart, lab work & pertinent test results  Airway Mallampati: II  TM Distance: >3 FB Neck ROM: full    Dental no notable dental hx.    Pulmonary neg pulmonary ROS, Current Smoker,    Pulmonary exam normal breath sounds clear to auscultation       Cardiovascular Exercise Tolerance: Good negative cardio ROS   Rhythm:regular Rate:Normal     Neuro/Psych negative neurological ROS  negative psych ROS   GI/Hepatic negative GI ROS, Neg liver ROS,   Endo/Other  negative endocrine ROS  Renal/GU negative Renal ROS  negative genitourinary   Musculoskeletal   Abdominal   Peds  Hematology negative hematology ROS (+)   Anesthesia Other Findings   Reproductive/Obstetrics negative OB ROS                             Anesthesia Physical Anesthesia Plan  ASA: II and emergent  Anesthesia Plan: General   Post-op Pain Management:    Induction:   PONV Risk Score and Plan: Ondansetron and Dexamethasone  Airway Management Planned:   Additional Equipment:   Intra-op Plan:   Post-operative Plan:   Informed Consent: I have reviewed the patients History and Physical, chart, labs and discussed the procedure including the risks, benefits and alternatives for the proposed anesthesia with the patient or authorized representative who has indicated his/her understanding and acceptance.   Dental Advisory Given  Plan Discussed with: CRNA  Anesthesia Plan Comments:         Anesthesia Quick Evaluation

## 2017-12-24 NOTE — ED Notes (Signed)
Patient transported to CT 

## 2017-12-25 ENCOUNTER — Encounter (HOSPITAL_COMMUNITY): Payer: Self-pay | Admitting: General Surgery

## 2018-01-07 ENCOUNTER — Ambulatory Visit: Payer: Self-pay | Admitting: General Surgery

## 2018-05-07 IMAGING — CR DG CHEST 2V
2 series · 2 of 2 positions shown · non-contrast
Comparison: None.

CLINICAL DATA: Chest pain lt arm pain fingers tinglingSOBBlurred

EXAM:
CHEST  2 VIEW

[chest pa]
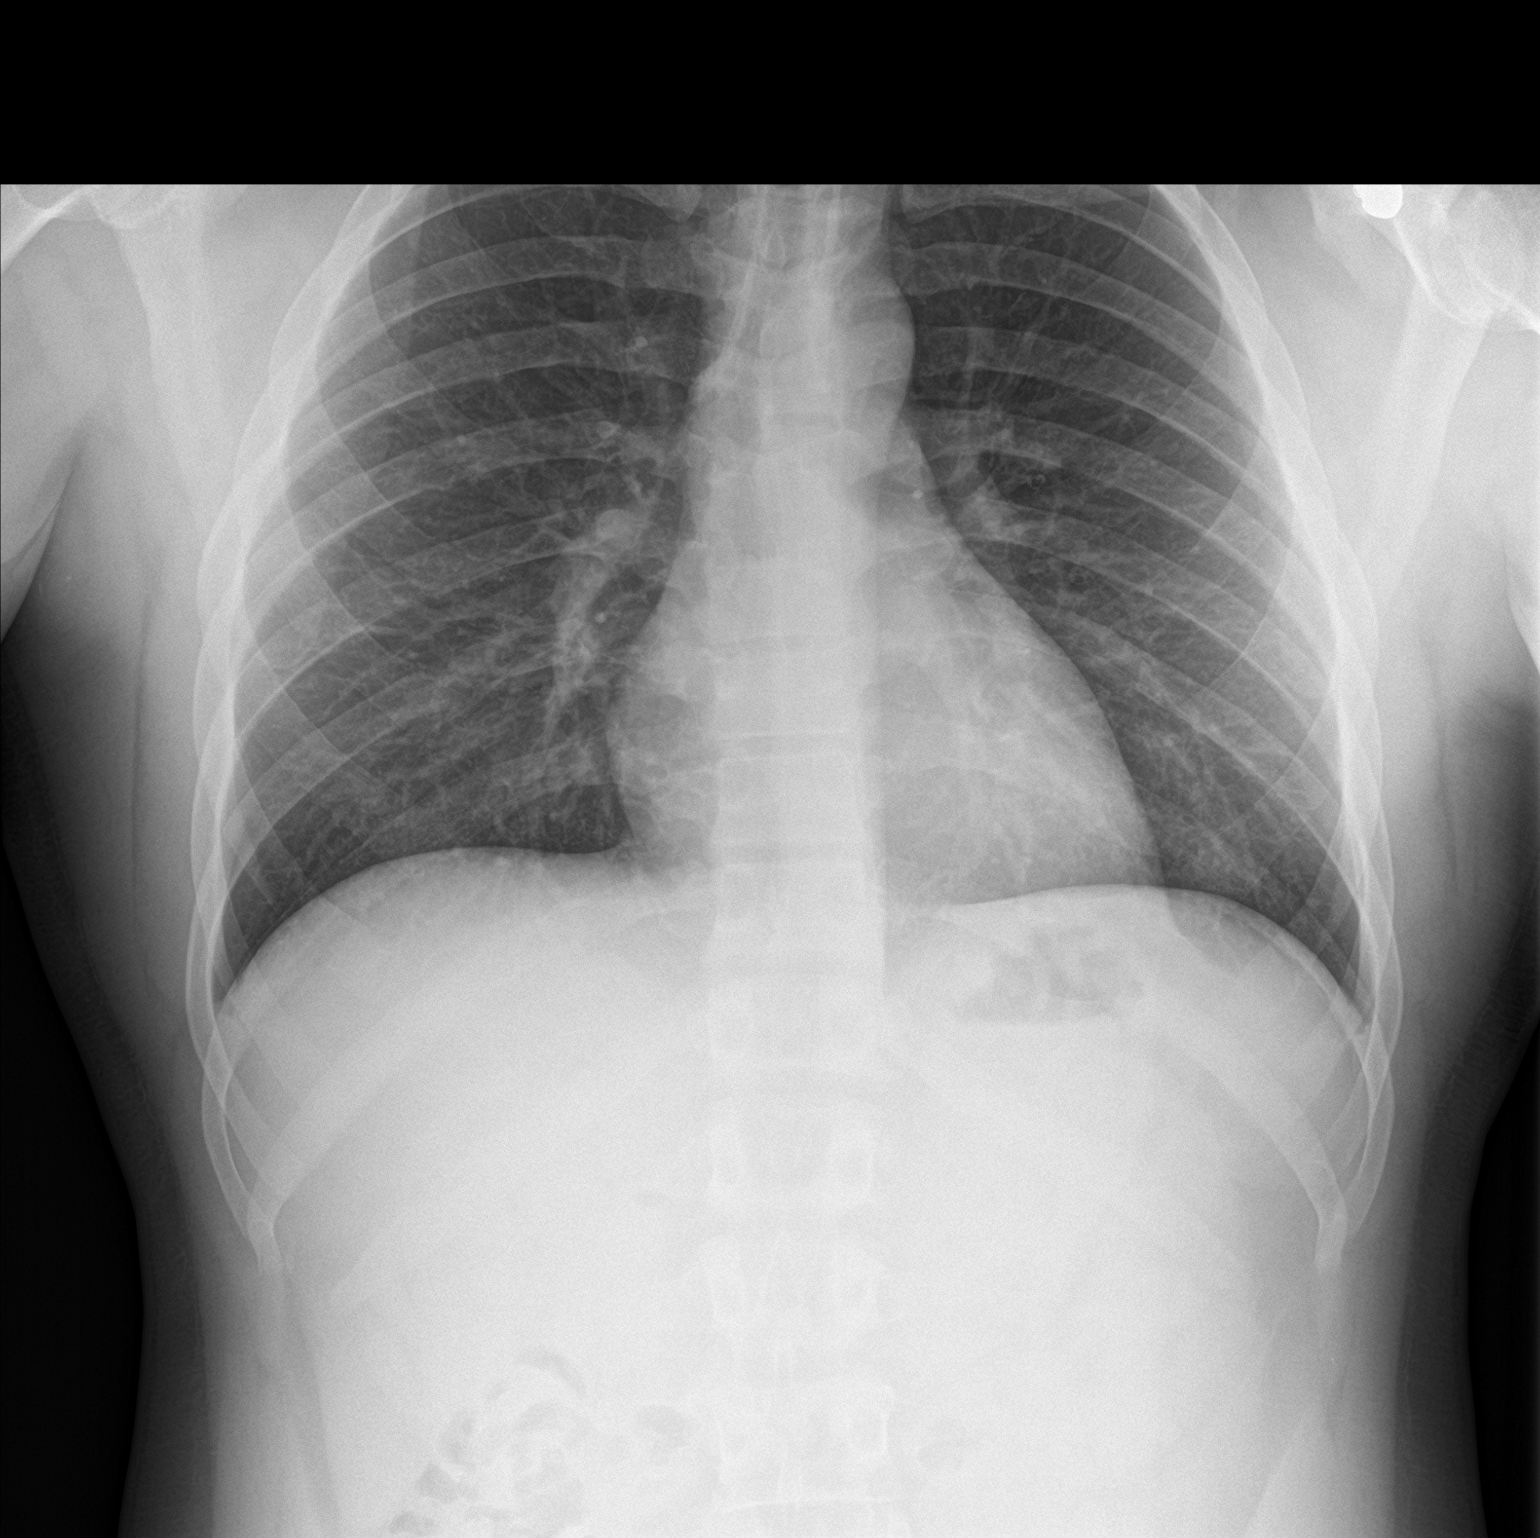

[chest lat]
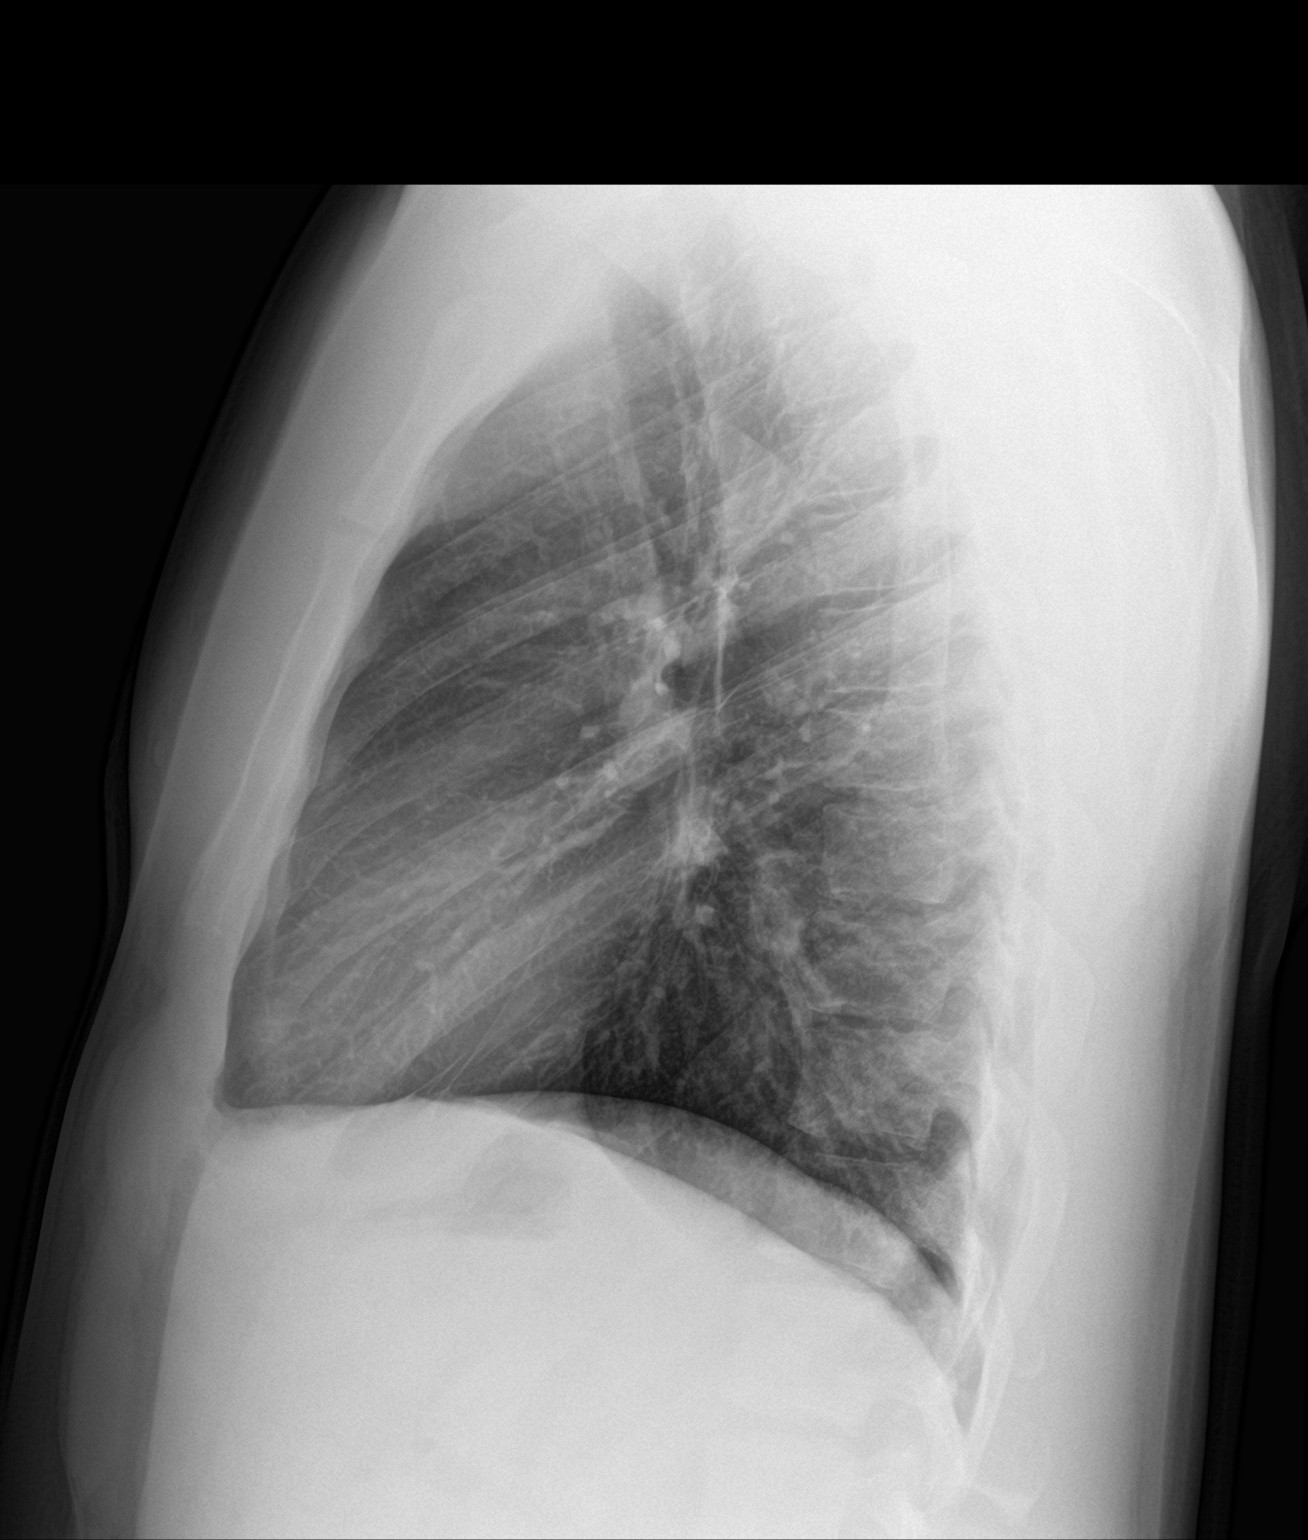

[2 of 2 positions shown; findings below may reference images not displayed]

FINDINGS: The heart size and mediastinal contours are within normal limits.
Both lungs are clear. No pleural effusion or pneumothorax. The
visualized skeletal structures are unremarkable.
IMPRESSION: No active cardiopulmonary disease.

## 2018-08-26 ENCOUNTER — Encounter (HOSPITAL_COMMUNITY): Payer: Self-pay | Admitting: Emergency Medicine

## 2018-08-26 ENCOUNTER — Other Ambulatory Visit: Payer: Self-pay

## 2018-08-26 ENCOUNTER — Emergency Department (HOSPITAL_COMMUNITY): Payer: Self-pay

## 2018-08-26 ENCOUNTER — Emergency Department (HOSPITAL_COMMUNITY)
Admission: EM | Admit: 2018-08-26 | Discharge: 2018-08-26 | Disposition: A | Discharge status: Home / Self Care | Payer: Self-pay | Attending: Emergency Medicine | Admitting: Emergency Medicine

## 2018-08-26 DIAGNOSIS — F17210 Nicotine dependence, cigarettes, uncomplicated: Secondary | ICD-10-CM | POA: Insufficient documentation

## 2018-08-26 DIAGNOSIS — Z91040 Latex allergy status: Secondary | ICD-10-CM | POA: Insufficient documentation

## 2018-08-26 DIAGNOSIS — R109 Unspecified abdominal pain: Secondary | ICD-10-CM

## 2018-08-26 DIAGNOSIS — K0889 Other specified disorders of teeth and supporting structures: Secondary | ICD-10-CM

## 2018-08-26 DIAGNOSIS — Z79899 Other long term (current) drug therapy: Secondary | ICD-10-CM | POA: Insufficient documentation

## 2018-08-26 LAB — CBC WITH DIFFERENTIAL/PLATELET
Abs Immature Granulocytes: 0.03 10*3/uL (ref 0.00–0.07)
Basophils Absolute: 0 10*3/uL (ref 0.0–0.1)
Basophils Relative: 1 %
Eosinophils Absolute: 0.1 10*3/uL (ref 0.0–0.5)
Eosinophils Relative: 1 %
HCT: 44.3 % (ref 39.0–52.0)
HEMOGLOBIN: 15 g/dL (ref 13.0–17.0)
Immature Granulocytes: 1 %
Lymphocytes Relative: 27 %
Lymphs Abs: 1.7 10*3/uL (ref 0.7–4.0)
MCH: 34.1 pg — ABNORMAL HIGH (ref 26.0–34.0)
MCHC: 33.9 g/dL (ref 30.0–36.0)
MCV: 100.7 fL — ABNORMAL HIGH (ref 80.0–100.0)
Monocytes Absolute: 0.6 10*3/uL (ref 0.1–1.0)
Monocytes Relative: 9 %
Neutro Abs: 4 10*3/uL (ref 1.7–7.7)
Neutrophils Relative %: 61 %
Platelets: 162 10*3/uL (ref 150–400)
RBC: 4.4 MIL/uL (ref 4.22–5.81)
RDW: 11.9 % (ref 11.5–15.5)
WBC: 6.4 10*3/uL (ref 4.0–10.5)
nRBC: 0 % (ref 0.0–0.2)

## 2018-08-26 LAB — URINALYSIS, ROUTINE W REFLEX MICROSCOPIC
Bacteria, UA: NONE SEEN
Bilirubin Urine: NEGATIVE
Glucose, UA: NEGATIVE mg/dL
Ketones, ur: NEGATIVE mg/dL
Nitrite: NEGATIVE
Protein, ur: NEGATIVE mg/dL
Specific Gravity, Urine: 1.029 (ref 1.005–1.030)
pH: 5 (ref 5.0–8.0)

## 2018-08-26 LAB — COMPREHENSIVE METABOLIC PANEL
ALT: 30 U/L (ref 0–44)
AST: 28 U/L (ref 15–41)
Albumin: 4.1 g/dL (ref 3.5–5.0)
Alkaline Phosphatase: 54 U/L (ref 38–126)
Anion gap: 7 (ref 5–15)
BILIRUBIN TOTAL: 1 mg/dL (ref 0.3–1.2)
BUN: 15 mg/dL (ref 6–20)
CO2: 25 mmol/L (ref 22–32)
Calcium: 9.6 mg/dL (ref 8.9–10.3)
Chloride: 107 mmol/L (ref 98–111)
Creatinine, Ser: 0.79 mg/dL (ref 0.61–1.24)
GFR calc Af Amer: >60 mL/min (ref >60)
GFR calc non Af Amer: >60 mL/min (ref >60)
GLUCOSE: 120 mg/dL — AB (ref 70–99)
POTASSIUM: 3.5 mmol/L (ref 3.5–5.1)
SODIUM: 139 mmol/L (ref 135–145)
TOTAL PROTEIN: 7.4 g/dL (ref 6.5–8.1)

## 2018-08-26 LAB — LIPASE, BLOOD: LIPASE: 26 U/L (ref 11–51)

## 2018-08-26 MED ORDER — IOHEXOL 300 MG/ML  SOLN
100.0000 mL | Freq: Once | INTRAMUSCULAR | Status: AC | PRN
  Administered 2018-08-26: 100 mL via INTRAVENOUS

## 2018-08-26 MED ORDER — PENICILLIN V POTASSIUM 250 MG PO TABS: 250.0000 mg | ORAL_TABLET | Freq: Four times a day (QID) | ORAL | 0 refills | Status: DC

## 2018-08-26 MED ORDER — NAPROXEN 250 MG PO TABS: 250.0000 mg | ORAL_TABLET | Freq: Two times a day (BID) | ORAL | 0 refills | Status: DC | PRN

## 2018-08-26 NOTE — Discharge Instructions (Signed)
Take the prescriptions as directed.  Call your regular medical doctor today to schedule a follow up appointment within the next 3 days. Call your Dentist today to schedule a follow up appointment within the next week.  Return to the Emergency Department immediately sooner if worsening.
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
## 2018-08-26 NOTE — ED Triage Notes (Signed)
Pt c/o lower left abd pain since last night, denies n/v/d/fever/urinary symptoms, pt also c/o lower left dental pain, pt reports he broke a took off last night

## 2018-08-26 NOTE — ED Notes (Signed)
Pt given water for fluid challenge, tolerating well at this time

## 2018-08-27 LAB — URINE CULTURE: Culture: NO GROWTH

## 2018-12-14 ENCOUNTER — Other Ambulatory Visit: Payer: Self-pay

## 2018-12-14 ENCOUNTER — Emergency Department (HOSPITAL_COMMUNITY)
Admission: EM | Admit: 2018-12-14 | Discharge: 2018-12-14 | Disposition: A | Discharge status: Home / Self Care | Payer: Self-pay | Attending: Emergency Medicine | Admitting: Emergency Medicine

## 2018-12-14 ENCOUNTER — Emergency Department (HOSPITAL_COMMUNITY): Payer: Self-pay

## 2018-12-14 ENCOUNTER — Encounter (HOSPITAL_COMMUNITY): Payer: Self-pay | Admitting: Emergency Medicine

## 2018-12-14 DIAGNOSIS — F419 Anxiety disorder, unspecified: Secondary | ICD-10-CM | POA: Insufficient documentation

## 2018-12-14 DIAGNOSIS — Z91040 Latex allergy status: Secondary | ICD-10-CM | POA: Insufficient documentation

## 2018-12-14 DIAGNOSIS — F17210 Nicotine dependence, cigarettes, uncomplicated: Secondary | ICD-10-CM | POA: Insufficient documentation

## 2018-12-14 DIAGNOSIS — Z79899 Other long term (current) drug therapy: Secondary | ICD-10-CM | POA: Insufficient documentation

## 2018-12-14 DIAGNOSIS — I10 Essential (primary) hypertension: Secondary | ICD-10-CM | POA: Insufficient documentation

## 2018-12-14 HISTORY — DX: Essential (primary) hypertension: I10

## 2018-12-14 LAB — CBC
HCT: 49.2 % (ref 39.0–52.0)
Hemoglobin: 17.5 g/dL — ABNORMAL HIGH (ref 13.0–17.0)
MCH: 34.5 pg — ABNORMAL HIGH (ref 26.0–34.0)
MCHC: 35.6 g/dL (ref 30.0–36.0)
MCV: 97 fL (ref 80.0–100.0)
Platelets: 180 10*3/uL (ref 150–400)
RBC: 5.07 MIL/uL (ref 4.22–5.81)
RDW: 11.9 % (ref 11.5–15.5)
WBC: 9.9 10*3/uL (ref 4.0–10.5)
nRBC: 0 % (ref 0.0–0.2)

## 2018-12-14 LAB — BASIC METABOLIC PANEL
Anion gap: 11 (ref 5–15)
BUN: 20 mg/dL (ref 6–20)
CO2: 25 mmol/L (ref 22–32)
Calcium: 9.8 mg/dL (ref 8.9–10.3)
Chloride: 100 mmol/L (ref 98–111)
Creatinine, Ser: 1.3 mg/dL — ABNORMAL HIGH (ref 0.61–1.24)
GFR calc Af Amer: >60 mL/min (ref >60)
GFR calc non Af Amer: >60 mL/min (ref >60)
Glucose, Bld: 82 mg/dL (ref 70–99)
Potassium: 3.7 mmol/L (ref 3.5–5.1)
Sodium: 136 mmol/L (ref 135–145)

## 2018-12-14 LAB — TROPONIN I: Troponin I: <0.03 ng/mL (ref <0.03)

## 2018-12-14 NOTE — Discharge Instructions (Addendum)
Mr. Jeremiah Zuniga,  You came to the ED due to chest pain. Your workup looked okay, there was no sign of any cardiac event. Your chest pain was most likely due to underlying anxiety. I want you to make sure to call and schedule an appointment with [facility] to establish care with a primary care physician who can help manage this.  Thank you for allowing [place] to be a part of your care!
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
## 2018-12-14 NOTE — ED Notes (Signed)
Patient verbalizes understanding of discharge instructions. Opportunity for questioning and answers were provided. Armband removed by staff, pt discharged from ED ambulatory.   

## 2019-05-03 ENCOUNTER — Emergency Department (HOSPITAL_COMMUNITY)
Admission: EM | Admit: 2019-05-03 | Discharge: 2019-05-03 | Disposition: A | Discharge status: Home / Self Care | Payer: Self-pay | Attending: Emergency Medicine | Admitting: Emergency Medicine

## 2019-05-03 ENCOUNTER — Emergency Department (HOSPITAL_COMMUNITY): Payer: Self-pay

## 2019-05-03 ENCOUNTER — Encounter (HOSPITAL_COMMUNITY): Payer: Self-pay | Admitting: Emergency Medicine

## 2019-05-03 ENCOUNTER — Other Ambulatory Visit: Payer: Self-pay

## 2019-05-03 DIAGNOSIS — F17210 Nicotine dependence, cigarettes, uncomplicated: Secondary | ICD-10-CM | POA: Insufficient documentation

## 2019-05-03 DIAGNOSIS — F419 Anxiety disorder, unspecified: Secondary | ICD-10-CM | POA: Insufficient documentation

## 2019-05-03 DIAGNOSIS — I10 Essential (primary) hypertension: Secondary | ICD-10-CM | POA: Insufficient documentation

## 2019-05-03 DIAGNOSIS — R0789 Other chest pain: Secondary | ICD-10-CM | POA: Insufficient documentation

## 2019-05-03 DIAGNOSIS — Z79899 Other long term (current) drug therapy: Secondary | ICD-10-CM | POA: Insufficient documentation

## 2019-05-03 MED ORDER — KETOROLAC TROMETHAMINE 30 MG/ML IJ SOLN
30.0000 mg | Freq: Once | INTRAMUSCULAR | Status: AC
  Administered 2019-05-03: 30 mg via INTRAMUSCULAR
  Filled 2019-05-03: qty 1

## 2019-05-03 MED ORDER — HYDROXYZINE HCL 25 MG PO TABS: 25.0000 mg | ORAL_TABLET | Freq: Four times a day (QID) | ORAL | 0 refills | Status: DC | PRN

## 2019-05-03 MED ORDER — HYDROXYZINE HCL 25 MG PO TABS
25.0000 mg | ORAL_TABLET | Freq: Once | ORAL | Status: AC
  Administered 2019-05-03: 25 mg via ORAL
  Filled 2019-05-03: qty 1

## 2019-05-03 NOTE — ED Triage Notes (Signed)
Pt c/o chest pain that he relates to anxiety x 3 weeks. States "he can't really eat." States he has been to a lot of different hospitals but doesn't stay when he goes.

## 2019-05-03 NOTE — ED Notes (Signed)
Pt reports having anxiety diagnosed in may 2020. Reports similar symptoms then as he is having now.

## 2019-05-03 NOTE — ED Notes (Signed)
EDP in with pt at this time.  

## 2019-05-03 NOTE — Discharge Instructions (Addendum)
You were seen today for chest pain.  Your work-up is reassuring.  You will be given hydroxyzine for anxiety.  Decrease stressors at home.  You need to establish care with a primary care physician.
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
## 2019-06-27 ENCOUNTER — Other Ambulatory Visit: Payer: Self-pay

## 2019-06-27 ENCOUNTER — Emergency Department (HOSPITAL_COMMUNITY)
Admission: EM | Admit: 2019-06-27 | Discharge: 2019-06-27 | Disposition: A | Discharge status: Home / Self Care | Payer: Self-pay

## 2019-06-27 NOTE — ED Triage Notes (Signed)
Called for triage, no response.

## 2019-06-27 NOTE — ED Triage Notes (Signed)
Called for triage no answer  

## 2019-07-19 ENCOUNTER — Emergency Department (HOSPITAL_COMMUNITY)
Admission: EM | Admit: 2019-07-19 | Discharge: 2019-07-19 | Disposition: A | Discharge status: Home / Self Care | Payer: Self-pay | Attending: Emergency Medicine | Admitting: Emergency Medicine

## 2019-07-19 ENCOUNTER — Encounter (HOSPITAL_COMMUNITY): Payer: Self-pay | Admitting: *Deleted

## 2019-07-19 ENCOUNTER — Other Ambulatory Visit: Payer: Self-pay

## 2019-07-19 DIAGNOSIS — R21 Rash and other nonspecific skin eruption: Secondary | ICD-10-CM | POA: Insufficient documentation

## 2019-07-19 DIAGNOSIS — Y939 Activity, unspecified: Secondary | ICD-10-CM | POA: Insufficient documentation

## 2019-07-19 DIAGNOSIS — F17210 Nicotine dependence, cigarettes, uncomplicated: Secondary | ICD-10-CM | POA: Insufficient documentation

## 2019-07-19 DIAGNOSIS — Y999 Unspecified external cause status: Secondary | ICD-10-CM | POA: Insufficient documentation

## 2019-07-19 DIAGNOSIS — I10 Essential (primary) hypertension: Secondary | ICD-10-CM | POA: Insufficient documentation

## 2019-07-19 DIAGNOSIS — Z91040 Latex allergy status: Secondary | ICD-10-CM | POA: Insufficient documentation

## 2019-07-19 DIAGNOSIS — S025XXA Fracture of tooth (traumatic), initial encounter for closed fracture: Secondary | ICD-10-CM | POA: Insufficient documentation

## 2019-07-19 DIAGNOSIS — X58XXXA Exposure to other specified factors, initial encounter: Secondary | ICD-10-CM | POA: Insufficient documentation

## 2019-07-19 DIAGNOSIS — Y929 Unspecified place or not applicable: Secondary | ICD-10-CM | POA: Insufficient documentation

## 2019-07-19 MED ORDER — CEPHALEXIN 500 MG PO CAPS
500.0000 mg | ORAL_CAPSULE | Freq: Once | ORAL | Status: AC
  Administered 2019-07-19: 04:00:00 500 mg via ORAL
  Filled 2019-07-19: qty 1

## 2019-07-19 MED ORDER — PREDNISONE 50 MG PO TABS
60.0000 mg | ORAL_TABLET | Freq: Once | ORAL | Status: AC
  Administered 2019-07-19: 04:00:00 60 mg via ORAL
  Filled 2019-07-19: qty 1

## 2019-07-19 MED ORDER — DIPHENHYDRAMINE HCL 25 MG PO CAPS
25.0000 mg | ORAL_CAPSULE | Freq: Once | ORAL | Status: AC
  Administered 2019-07-19: 25 mg via ORAL
  Filled 2019-07-19: qty 1

## 2019-07-19 MED ORDER — PREDNISONE 50 MG PO TABS: ORAL_TABLET | ORAL | 0 refills | Status: AC

## 2019-07-19 MED ORDER — CEPHALEXIN 500 MG PO CAPS: 500.0000 mg | ORAL_CAPSULE | Freq: Two times a day (BID) | ORAL | 0 refills | Status: AC
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
## 2019-07-19 NOTE — ED Triage Notes (Signed)
Pt c/o insect bite to right arm; pt also c/o chipped tooth to right upper jaw

## 2019-08-12 DEATH — deceased
